# Patient Record
Sex: Male | Born: 1980 | Race: White | Hispanic: No | Marital: Single | Smoking: Never smoker
Health system: Southern US, Community
[De-identification: ages and names within clinical notes are randomized; demographics above are authoritative.]

---
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
## 2018-04-23 ENCOUNTER — Emergency Department (HOSPITAL_COMMUNITY): Payer: Self-pay

## 2018-04-23 ENCOUNTER — Other Ambulatory Visit: Payer: Self-pay

## 2018-04-23 ENCOUNTER — Emergency Department (HOSPITAL_COMMUNITY)
Admission: EM | Admit: 2018-04-23 | Discharge: 2018-04-23 | Disposition: A | Discharge status: Home / Self Care | Payer: Self-pay | Attending: Emergency Medicine | Admitting: Emergency Medicine

## 2018-04-23 ENCOUNTER — Encounter (HOSPITAL_COMMUNITY): Payer: Self-pay | Admitting: Emergency Medicine

## 2018-04-23 DIAGNOSIS — N200 Calculus of kidney: Secondary | ICD-10-CM | POA: Insufficient documentation

## 2018-04-23 DIAGNOSIS — R1110 Vomiting, unspecified: Secondary | ICD-10-CM | POA: Insufficient documentation

## 2018-04-23 DIAGNOSIS — F1210 Cannabis abuse, uncomplicated: Secondary | ICD-10-CM | POA: Insufficient documentation

## 2018-04-23 LAB — CBC WITH DIFFERENTIAL/PLATELET
Abs Immature Granulocytes: 0.1 10*3/uL (ref 0.0–0.1)
BASOS PCT: 0 %
Basophils Absolute: 0.1 10*3/uL (ref 0.0–0.1)
EOS ABS: 0.1 10*3/uL (ref 0.0–0.7)
Eosinophils Relative: 1 %
HEMATOCRIT: 48.5 % (ref 39.0–52.0)
Hemoglobin: 16.4 g/dL (ref 13.0–17.0)
Immature Granulocytes: 1 %
LYMPHS ABS: 0.7 10*3/uL (ref 0.7–4.0)
Lymphocytes Relative: 5 %
MCH: 28.9 pg (ref 26.0–34.0)
MCHC: 33.8 g/dL (ref 30.0–36.0)
MCV: 85.5 fL (ref 78.0–100.0)
Monocytes Absolute: 0.6 10*3/uL (ref 0.1–1.0)
Monocytes Relative: 4 %
Neutro Abs: 12.4 10*3/uL — ABNORMAL HIGH (ref 1.7–7.7)
Neutrophils Relative %: 89 %
Platelets: 183 10*3/uL (ref 150–400)
RBC: 5.67 MIL/uL (ref 4.22–5.81)
RDW: 11.9 % (ref 11.5–15.5)
WBC: 13.8 10*3/uL — AB (ref 4.0–10.5)

## 2018-04-23 LAB — COMPREHENSIVE METABOLIC PANEL
ALK PHOS: 58 U/L (ref 38–126)
ALT: 28 U/L (ref 0–44)
AST: 20 U/L (ref 15–41)
Albumin: 4.5 g/dL (ref 3.5–5.0)
Anion gap: 9 (ref 5–15)
BILIRUBIN TOTAL: 0.9 mg/dL (ref 0.3–1.2)
BUN: 14 mg/dL (ref 6–20)
CALCIUM: 10 mg/dL (ref 8.9–10.3)
CO2: 24 mmol/L (ref 22–32)
CREATININE: 1.4 mg/dL — AB (ref 0.61–1.24)
Chloride: 108 mmol/L (ref 98–111)
Glucose, Bld: 120 mg/dL — ABNORMAL HIGH (ref 70–99)
Potassium: 4.2 mmol/L (ref 3.5–5.1)
SODIUM: 141 mmol/L (ref 135–145)
TOTAL PROTEIN: 7.3 g/dL (ref 6.5–8.1)

## 2018-04-23 LAB — URINALYSIS, ROUTINE W REFLEX MICROSCOPIC
Bilirubin Urine: NEGATIVE
Glucose, UA: NEGATIVE mg/dL
Ketones, ur: 5 mg/dL — AB
Leukocytes, UA: NEGATIVE
NITRITE: NEGATIVE
PH: 5 (ref 5.0–8.0)
Protein, ur: NEGATIVE mg/dL
Specific Gravity, Urine: 1.028 (ref 1.005–1.030)

## 2018-04-23 LAB — LIPASE, BLOOD: Lipase: 30 U/L (ref 11–51)

## 2018-04-23 MED ORDER — ONDANSETRON 4 MG PO TBDP
4.0000 mg | ORAL_TABLET | Freq: Once | ORAL | Status: AC
  Administered 2018-04-23: 4 mg via ORAL
  Filled 2018-04-23: qty 1

## 2018-04-23 MED ORDER — KETOROLAC TROMETHAMINE 60 MG/2ML IM SOLN
60.0000 mg | Freq: Once | INTRAMUSCULAR | Status: AC
Start: 2018-04-23 — End: 2018-04-23
  Administered 2018-04-23: 60 mg via INTRAMUSCULAR
  Filled 2018-04-23: qty 2

## 2018-04-23 MED ORDER — FENTANYL CITRATE (PF) 100 MCG/2ML IJ SOLN
50.0000 ug | Freq: Once | INTRAMUSCULAR | Status: AC
Start: 2018-04-23 — End: 2018-04-23
  Administered 2018-04-23: 50 ug via INTRAMUSCULAR
  Filled 2018-04-23: qty 2

## 2018-04-23 MED ORDER — KETOROLAC TROMETHAMINE 10 MG PO TABS: 10.0000 mg | ORAL_TABLET | Freq: Four times a day (QID) | ORAL | 0 refills | Status: DC | PRN

## 2018-04-23 MED ORDER — TAMSULOSIN HCL 0.4 MG PO CAPS: 0.4000 mg | ORAL_CAPSULE | Freq: Every day | ORAL | 0 refills | Status: DC

## 2018-04-23 MED ORDER — OXYCODONE-ACETAMINOPHEN 5-325 MG PO TABS: 1.0000 | ORAL_TABLET | ORAL | 0 refills | Status: DC | PRN

## 2018-04-23 MED ORDER — TAMSULOSIN HCL 0.4 MG PO CAPS
0.4000 mg | ORAL_CAPSULE | Freq: Once | ORAL | Status: AC
  Administered 2018-04-23: 0.4 mg via ORAL
  Filled 2018-04-23: qty 1

## 2018-04-23 MED ORDER — ONDANSETRON HCL 4 MG PO TABS: 4.0000 mg | ORAL_TABLET | Freq: Three times a day (TID) | ORAL | 0 refills | Status: DC | PRN

## 2018-04-23 MED ORDER — OXYCODONE-ACETAMINOPHEN 5-325 MG PO TABS
1.0000 | ORAL_TABLET | Freq: Once | ORAL | Status: AC
  Administered 2018-04-23: 1 via ORAL
  Filled 2018-04-23: qty 1

## 2018-04-23 NOTE — ED Triage Notes (Signed)
Pt st's right flank pain woke him up this am.  St's pain has increased through out the day.  Pt also st's pain has caused him to have nausea and vomiting.

## 2018-04-23 NOTE — ED Provider Notes (Signed)
Emergency Department Provider Note   I have reviewed the triage vital signs and the nursing notes.   HISTORY  Chief Complaint Flank Pain   HPI Adam Padilla is a 37 y.o. male without significant past medical history the presents the emerge department today with approximately 12 hours of right flank pain.  Patient states that he noticed a dull ache that radiated from his right flank down to the top of his buttocks earlier this morning but then progressively worsened throughout the day and was very colicky in nature at one point having him on his hands and knees vomiting the pain was so bad.  Presented here for further evaluation.  No change in urination or defecation.  Patient had already been evaluated but at the time of my evaluation he does not have any nausea and his pain was significantly proved. No other associated or modifying symptoms.    History reviewed. No pertinent past medical history.  There are no active problems to display for this patient.   History reviewed. No pertinent surgical history.    Allergies Patient has no allergy information on record.  No family history on file.  Social History Social History   Tobacco Use  . Smoking status: Never Smoker  . Smokeless tobacco: Never Used  Substance Use Topics  . Alcohol use: Yes    Comment: very little  . Drug use: Yes    Types: Marijuana    Review of Systems  All other systems negative except as documented in the HPI. All pertinent positives and negatives as reviewed in the HPI. ____________________________________________   PHYSICAL EXAM:  VITAL SIGNS: ED Triage Vitals  Enc Vitals Group     BP 04/23/18 1531 (!) 149/88     Pulse Rate 04/23/18 1531 71     Resp 04/23/18 1531 16     Temp 04/23/18 1531 98.7 F (37.1 C)     Temp Source 04/23/18 1531 Oral     SpO2 04/23/18 1531 100 %     Weight 04/23/18 1545 205 lb (93 kg)     Height 04/23/18 1545 5\' 10"  (1.778 m)    Constitutional:  Alert and oriented. Well appearing and in no acute distress. Eyes: Conjunctivae are normal. PERRL. EOMI. Head: Atraumatic. Nose: No congestion/rhinnorhea. Mouth/Throat: Mucous membranes are moist.  Oropharynx non-erythematous. Neck: No stridor.  No meningeal signs.   Cardiovascular: Normal rate, regular rhythm. Good peripheral circulation. Grossly normal heart sounds.   Respiratory: Normal respiratory effort.  No retractions. Lungs CTAB. Gastrointestinal: Soft and nontender. No distention.  Musculoskeletal: No lower extremity tenderness nor edema. No gross deformities of extremities. Neurologic:  Normal speech and language. No gross focal neurologic deficits are appreciated.  Skin:  Skin is warm, dry and intact. No rash noted.   ____________________________________________   LABS (all labs ordered are listed, but only abnormal results are displayed)  Labs Reviewed  COMPREHENSIVE METABOLIC PANEL - Abnormal; Notable for the following components:      Result Value   Glucose, Bld 120 (*)    Creatinine, Ser 1.40 (*)    All other components within normal limits  CBC WITH DIFFERENTIAL/PLATELET - Abnormal; Notable for the following components:   WBC 13.8 (*)    Neutro Abs 12.4 (*)    All other components within normal limits  URINALYSIS, ROUTINE W REFLEX MICROSCOPIC - Abnormal; Notable for the following components:   APPearance TURBID (*)    Hgb urine dipstick MODERATE (*)    Ketones, ur 5 (*)  Bacteria, UA RARE (*)    All other components within normal limits  LIPASE, BLOOD   ____________________________________________  RADIOLOGY  Ct Renal Stone Study  Result Date: 04/23/2018 CLINICAL DATA:  Right flank pain since 2 p.m. EXAM: CT ABDOMEN AND PELVIS WITHOUT CONTRAST TECHNIQUE: Multidetector CT imaging of the abdomen and pelvis was performed following the standard protocol without IV contrast. COMPARISON:  None. FINDINGS: Lower chest: No acute abnormality. Hepatobiliary: No focal  liver abnormality is seen. No gallstones, gallbladder wall thickening, or biliary dilatation. Pancreas: Unremarkable. No pancreatic ductal dilatation or surrounding inflammatory changes. Spleen: Normal in size without focal abnormality. Adrenals/Urinary Tract: Bilateral adrenal glands are normal. There is right perinephric stranding with mild right hydroureteronephrosis due to obstruction by 1 mm stone in the distal right ureter just proximal to the ureteral vesicular junction. There is a 1 mm nonobstructing stone in left kidney. There is no left hydronephrosis. The bladder is partially decompressed without gross abnormality. Stomach/Bowel: Stomach is within normal limits. Appendix appears normal. No evidence of bowel wall thickening, distention, or inflammatory changes. Vascular/Lymphatic: No significant vascular findings are present. No enlarged abdominal or pelvic lymph nodes. Reproductive: Prostate is unremarkable. Other: None. 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 IMPRESSION: Mild right hydroureteronephrosis due to obstruction by 1 mm stone in the distal right ureter just proximal to the ureteral vesicular junction. Electronically Signed   By: Sherian Rein M.D.   On: 04/23/2018 17:32    ____________________________________________   INITIAL IMPRESSION / ASSESSMENT AND PLAN / ED COURSE  With hydronephrosis mild kidney injury secondary to kidney stone.  States that is 1 mm on CT scan however I feel this is likely just catching the edge of it is likely a little bit larger.  Patient is pain-free at this time will continue some narcotic pain medicine along with Toradol flomax and pending his urine to evaluate for infection.  Reevaluate but suspect patient will be stable for discharge  Symptom-free.  No urinary tract infection.  I suspect his renal function will improve with normal hydration after the kidney stone passes.  Stable for discharge.  Pertinent labs & imaging results  that were available during my care of the patient were reviewed by me and considered in my medical decision making (see chart for details).  ____________________________________________  FINAL CLINICAL IMPRESSION(S) / ED DIAGNOSES  Final diagnoses:  Kidney stone     MEDICATIONS GIVEN DURING THIS VISIT:  Medications  ondansetron (ZOFRAN-ODT) disintegrating tablet 4 mg (4 mg Oral Given 04/23/18 1600)  fentaNYL (SUBLIMAZE) injection 50 mcg (50 mcg Intramuscular Given 04/23/18 1601)  oxyCODONE-acetaminophen (PERCOCET/ROXICET) 5-325 MG per tablet 1 tablet (1 tablet Oral Given 04/23/18 1827)  ketorolac (TORADOL) injection 60 mg (60 mg Intramuscular Given 04/23/18 1827)  tamsulosin (FLOMAX) capsule 0.4 mg (0.4 mg Oral Given 04/23/18 1827)     NEW OUTPATIENT MEDICATIONS STARTED DURING THIS VISIT:  New Prescriptions   KETOROLAC (TORADOL) 10 MG TABLET    Take 1 tablet (10 mg total) by mouth every 6 (six) hours as needed.   ONDANSETRON (ZOFRAN) 4 MG TABLET    Take 1 tablet (4 mg total) by mouth every 8 (eight) hours as needed for nausea or vomiting.   OXYCODONE-ACETAMINOPHEN (PERCOCET) 5-325 MG TABLET    Take 1-2 tablets by mouth every 4 (four) hours as needed for severe pain.   TAMSULOSIN (FLOMAX) 0.4 MG CAPS CAPSULE    Take 1 capsule (0.4 mg total) by mouth daily. Until you pass the stone  Note:  This note was prepared with assistance of Dragon voice recognition software. Occasional wrong-word or sound-a-like substitutions may have occurred due to the inherent limitations of voice recognition software.   Marily Memos, MD 04/23/18 Jerene Bears

## 2018-04-23 NOTE — ED Notes (Signed)
Patient ambulatory to bathroom with steady gait at this time to attempt to provide urine sample

## 2018-04-23 NOTE — ED Notes (Signed)
ED Provider at bedside. Messner 

## 2018-04-23 NOTE — ED Provider Notes (Signed)
Patient placed in Quick Look pathway, seen and evaluated   Chief Complaint: right flank pain  HPI:   37 year old male presents with right flank pain that began this morning upon awakening around 7 AM this morning.  Patient reports that his pain initially was dull, but it has become sharper and more intense as the days progressed.  Patient reports some urinary urgency associated with this.  He notes pain has not made him nauseous and have several episodes of nonbilious, nonbloody emesis.  He has not been able to taste anything for this secondary to nausea.  He denies history of the same.  No history of kidney stones.  Patient denies any fever, abdominal pain, diarrhea, penile pain, testicular pain, penile discharge.  No prior abdominal surgeries.  He denies any back trauma.  ROS:  Positive ROS: (+) flank pain and urinary urgency Negative ROS: (-) fever, abdominal pain, diarrhea, penile pain, testicular pain penile discharge  Physical Exam:   Gen: No distress  Neuro: Awake and Alert  Skin: Warm  MSK: No thoracic, lumbar spinous process tenderness palpation or step-offs.  No paraspinal tenderness palpation.  Focused Exam: Heart RRR, nml S1,S2, no m/r/g; Lungs CTAB; Abd soft, NT, no rebound or guarding; No CVA tenderness  BP (!) 149/88 (BP Location: Right Arm)   Pulse 71   Temp 98.7 F (37.1 C) (Oral)   Resp 16   Ht 5\' 10"  (1.778 m)   Wt 93 kg   SpO2 100%   BMI 29.41 kg/m   Plan:  Based on initial evaluation, labs are indicated and radiology studies are indicated.  Patient counseled on process, plan, and necessity for staying for completing the evaluation."  Initiation of care has begun. The patient has been counseled on the process, plan, and necessity for staying for the completion/evaluation, and the remainder of the medical screening examination   Princella PellegriniMaczis, Davionte Lusby M, PA-C 04/23/18 1551    Pricilla LovelessGoldston, Scott, MD 04/24/18 1750

## 2018-12-14 IMAGING — CT CT RENAL STONE PROTOCOL
2 of 4 series · 16 of 46 positions shown, 18 images · non-contrast
Comparison: None.

CLINICAL DATA: Right flank pain since 2 p.m..

EXAM:
CT ABDOMEN AND PELVIS WITHOUT CONTRAST
TECHNIQUE: Multidetector CT imaging of the abdomen and pelvis was performed
following the standard protocol without IV contrast.

[Series 3: stone study 5.0 i30f 2 · axial · 0.76mm/px · z∈[+621,+1076]mm · 13 of 99 slices shown, 15 images]
[im 4/99  soft-tissue]
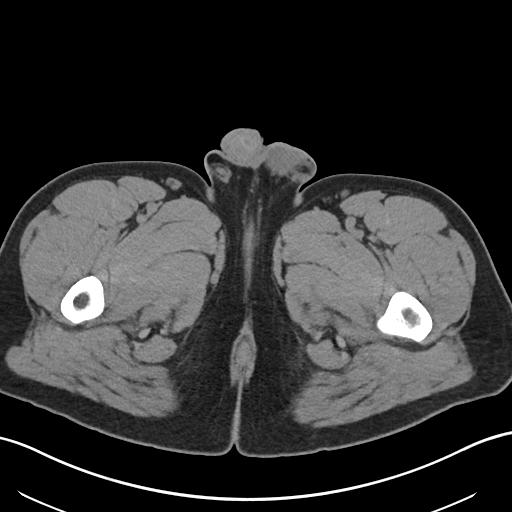
[im 4/99  bone]
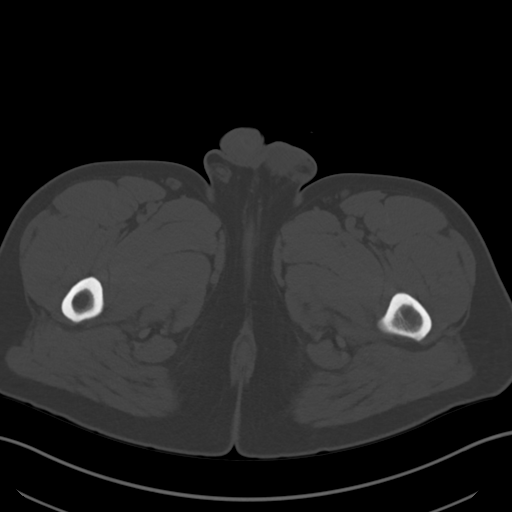
[im 12/99  soft-tissue]
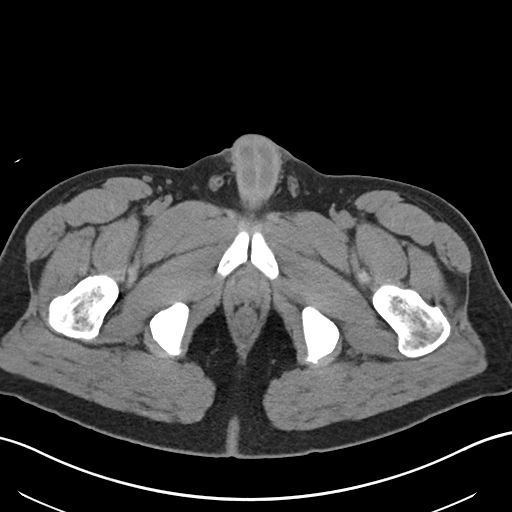
[im 20/99  soft-tissue]
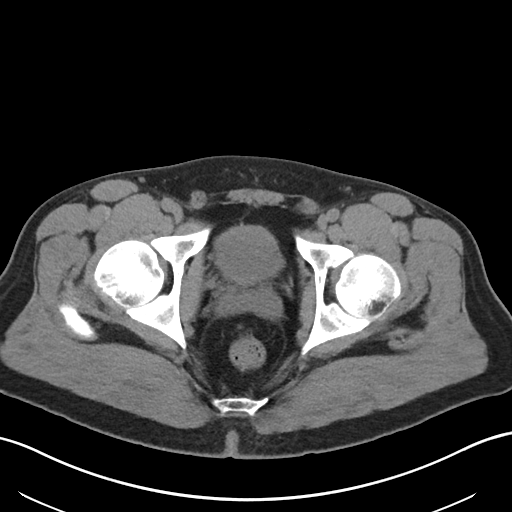
[im 28/99  soft-tissue]
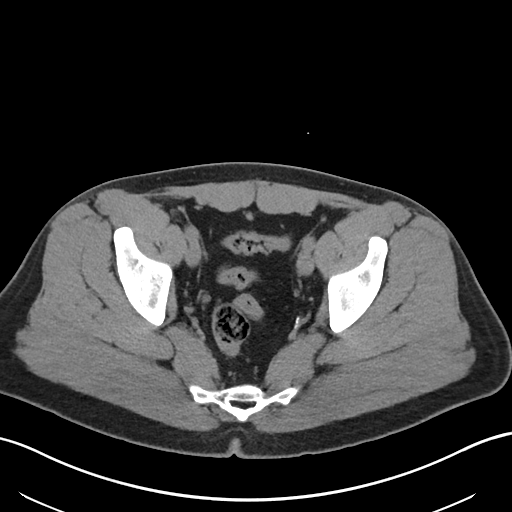
[im 36/99  soft-tissue]
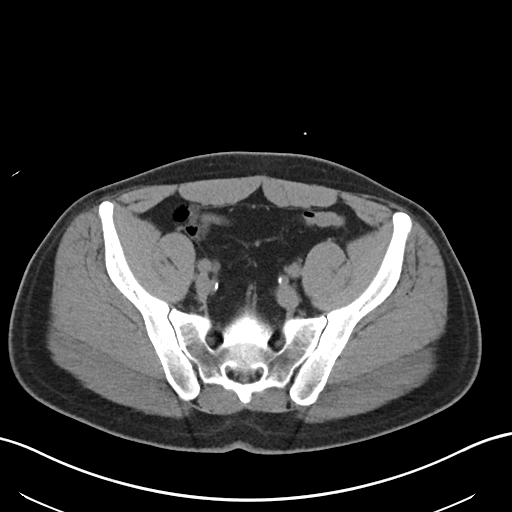
[im 44/99  soft-tissue]
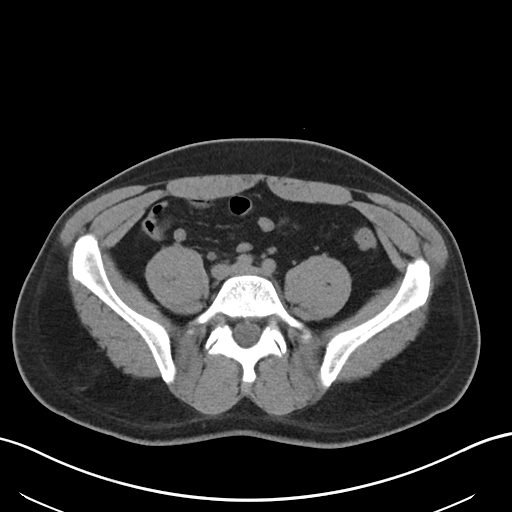
[im 51/99  soft-tissue]
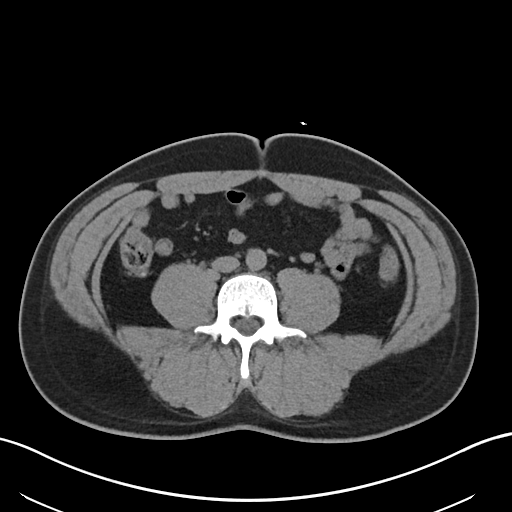
[im 55/99  soft-tissue]
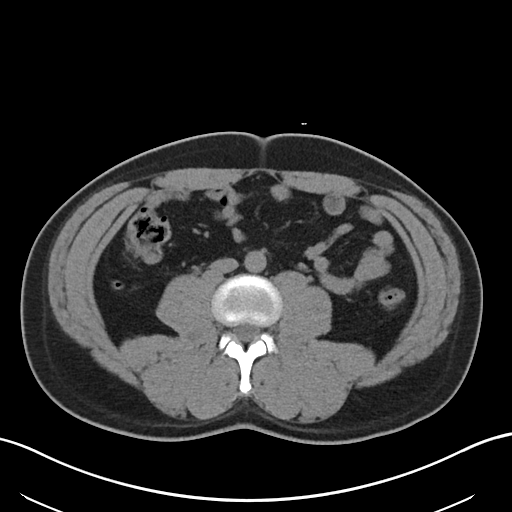
[im 63/99  soft-tissue]
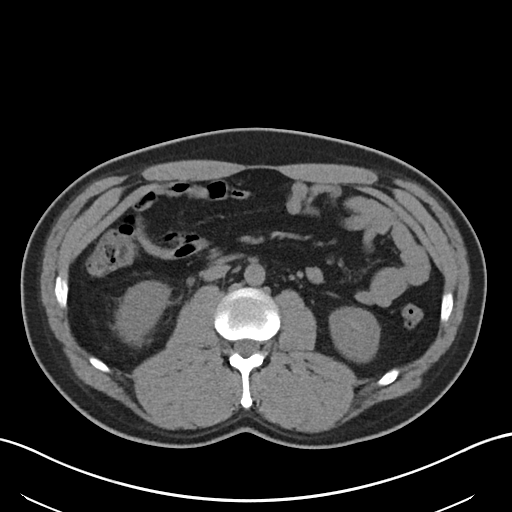
[im 63/99  bone]
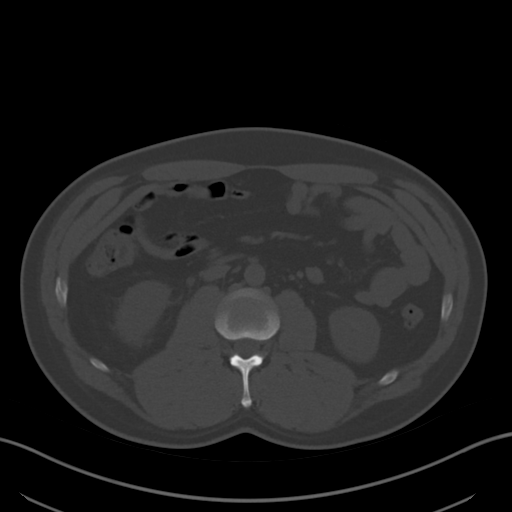
[im 71/99  soft-tissue]
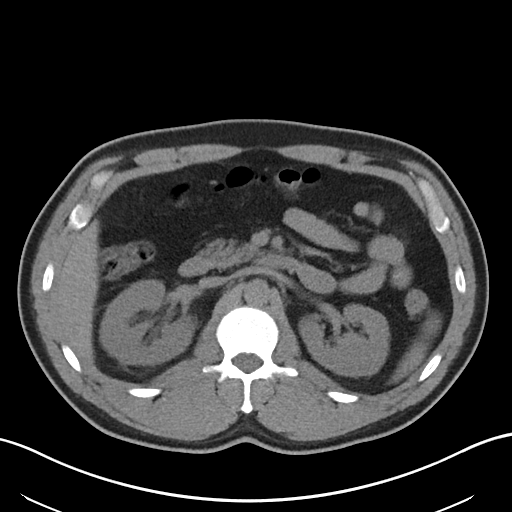
[im 79/99  soft-tissue]
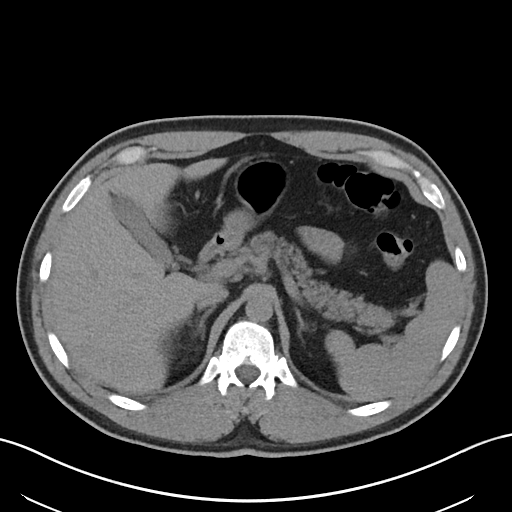
[im 87/99  soft-tissue]
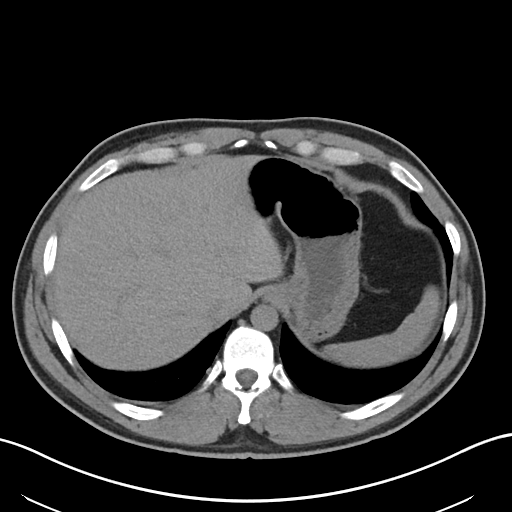
[im 95/99  soft-tissue]
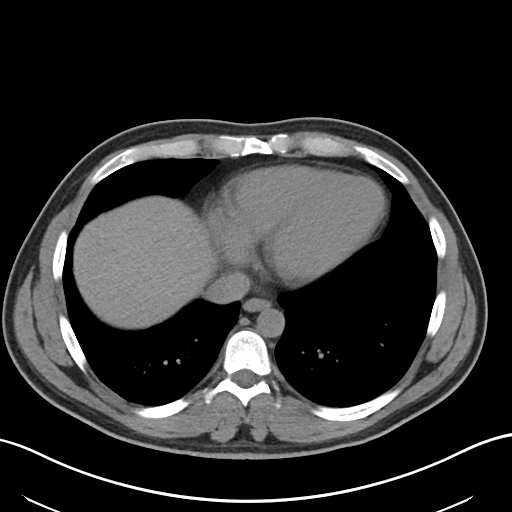

[Series 6: coronal soft tissue · coronal · 0.72mm/px · 3 of 101 slices shown]
[im 34/101  soft-tissue]
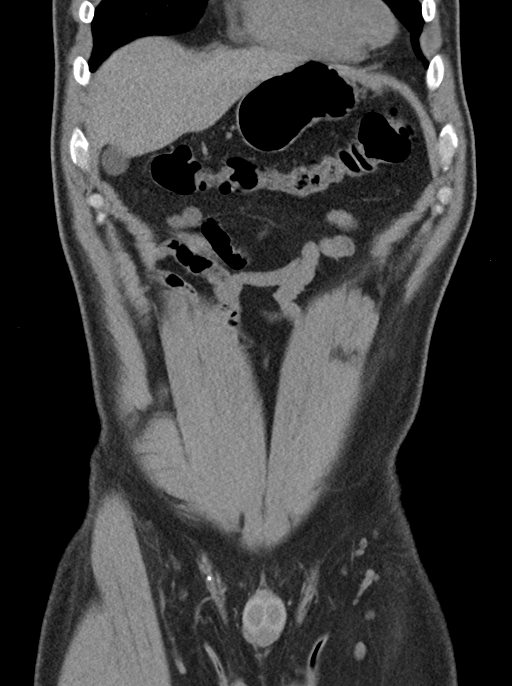
[im 45/101  soft-tissue]
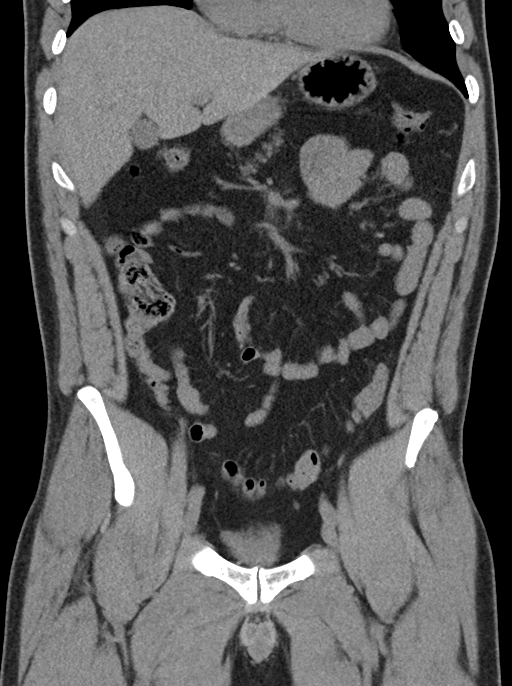
[im 56/101  soft-tissue]
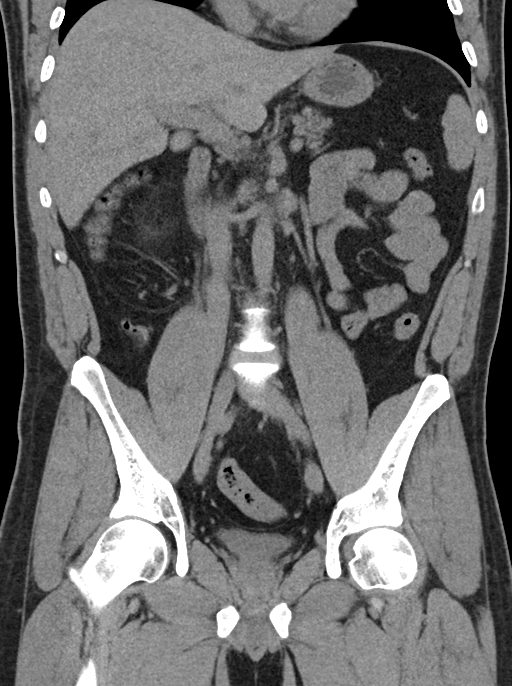

[16 of 46 positions shown; findings below may reference images not displayed]

FINDINGS: Lower chest: No acute abnormality.

Hepatobiliary: No focal liver abnormality is seen. No gallstones,
gallbladder wall thickening, or biliary dilatation.

Pancreas: Unremarkable. No pancreatic ductal dilatation or
surrounding inflammatory changes.

Spleen: Normal in size without focal abnormality.

Adrenals/Urinary Tract: Bilateral adrenal glands are normal. There
is right perinephric stranding with mild right hydroureteronephrosis
due to obstruction by 1 mm stone in the distal right ureter just
proximal to the ureteral vesicular junction. There is a 1 mm
nonobstructing stone in left kidney. There is no left
hydronephrosis. The bladder is partially decompressed without gross
abnormality.

Stomach/Bowel: Stomach is within normal limits. Appendix appears
normal. No evidence of bowel wall thickening, distention, or
inflammatory changes.

Vascular/Lymphatic: No significant vascular findings are present. No
enlarged abdominal or pelvic lymph nodes.

Reproductive: Prostate is unremarkable.

Other: None.

Musculoskeletal: Bilateral chronic pars defect of L5 are noted.
IMPRESSION: Mild right hydroureteronephrosis due to obstruction by 1 mm stone in
the distal right ureter just proximal to the ureteral vesicular
junction.

## 2023-03-05 ENCOUNTER — Ambulatory Visit: Payer: Self-pay | Admitting: Psychology

## 2023-12-14 ENCOUNTER — Ambulatory Visit (INDEPENDENT_AMBULATORY_CARE_PROVIDER_SITE_OTHER)
Admit: 2023-12-14 | Discharge: 2023-12-14 | Disposition: A | Discharge status: Home / Self Care | Attending: Family Medicine | Admitting: Family Medicine

## 2023-12-14 ENCOUNTER — Ambulatory Visit (HOSPITAL_BASED_OUTPATIENT_CLINIC_OR_DEPARTMENT_OTHER)
Admission: EM | Admit: 2023-12-14 | Discharge: 2023-12-14 | Disposition: A | Discharge status: Home / Self Care | Attending: Family Medicine | Admitting: Family Medicine

## 2023-12-14 ENCOUNTER — Encounter (HOSPITAL_BASED_OUTPATIENT_CLINIC_OR_DEPARTMENT_OTHER): Payer: Self-pay | Admitting: Emergency Medicine

## 2023-12-14 ENCOUNTER — Other Ambulatory Visit (HOSPITAL_BASED_OUTPATIENT_CLINIC_OR_DEPARTMENT_OTHER): Payer: Self-pay

## 2023-12-14 DIAGNOSIS — M25512 Pain in left shoulder: Secondary | ICD-10-CM

## 2023-12-14 DIAGNOSIS — M7552 Bursitis of left shoulder: Secondary | ICD-10-CM | POA: Diagnosis not present

## 2023-12-14 MED ORDER — CYCLOBENZAPRINE HCL 10 MG PO TABS
10.0000 mg | ORAL_TABLET | Freq: Every evening | ORAL | 0 refills | Status: AC | PRN
  Filled 2023-12-14: qty 15, 15d supply, fill #0

## 2023-12-14 MED ORDER — DICLOFENAC SODIUM 50 MG PO TBEC
50.0000 mg | DELAYED_RELEASE_TABLET | Freq: Three times a day (TID) | ORAL | 0 refills | Status: AC | PRN
  Filled 2023-12-14: qty 30, 10d supply, fill #0

## 2023-12-14 MED ORDER — PREDNISONE 20 MG PO TABS
20.0000 mg | ORAL_TABLET | Freq: Every day | ORAL | 0 refills | Status: AC
  Filled 2023-12-14: qty 5, 5d supply, fill #0

## 2023-12-14 NOTE — ED Triage Notes (Signed)
 Pt had a accident on a horse 2 months ago and is not sure if its causing the left shoulder pain but over that past 2 weeks ago he has had pain/ muscle spasms to left shoulder over the weekend pain was worse.

## 2023-12-14 NOTE — ED Provider Notes (Signed)
 [facility]    CSN: [id number] Arrival date & time: 12/14/23  0802      History   Chief Complaint No chief complaint on file.   HPI Adam Padilla is a 43 y.o. male.   Patient reports that he had an accident with his horse in early January 2025 and landed on his left shoulder.  He has had intermittent pain since that time in the left shoulder.  He has had good range of motion and mostly has felt fine.  On approximately 11/27/2023, he started with worsening left shoulder pain.  He does not remember a new or acute injury.  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  The pain has worsened almost daily since the middle of March.  Last night he went to sleep and within an hour he was awake with just pain of 7-10 on a 10 point scale in the left shoulder.  He ended up pacing all night and moving his shoulder trying to get some relief from pain.  He is walking around the room right now with his shoulder extended upwards and that helps the pain some.  He also often holds his arm similar to if it were in a sling and that sometimes relieves the pain.  Over-the-counter medications have not really helped his pain.     History reviewed. No pertinent past medical history.  There are no active problems to display for this patient.   History reviewed. No pertinent surgical history.     Home Medications    Prior to Admission medications   Medication Sig Start Date End Date Taking? Authorizing Provider  cyclobenzaprine (FLEXERIL) 10 MG tablet Take 1 tablet (10 mg total) by mouth at bedtime as needed for up to 15 days for muscle spasms. Do not use and drive. 12/14/23 12/29/23 Yes Prescilla Sours, FNP  diclofenac (VOLTAREN) 50 MG EC tablet Take 1 tablet (50 mg total) by mouth 3 (three) times daily with meals as needed for up to 10 days (shoulder pain). 12/14/23 12/24/23 Yes Prescilla Sours, FNP  predniSONE (DELTASONE) 20 MG tablet Take 1 tablet (20 mg total) by mouth daily  with breakfast for 5 days. 12/14/23 12/19/23 Yes Prescilla Sours, FNP    Family History History reviewed. No pertinent family history.  Social History Social History   Tobacco Use   Smoking status: Never   Smokeless tobacco: Never  Substance Use Topics   Alcohol use: Never   Drug use: Yes    Types: Marijuana     Allergies   Aspirin   Review of Systems Review of Systems  Constitutional:  Negative for fever.  Respiratory:  Negative for cough.   Cardiovascular:  Negative for chest pain.  Gastrointestinal:  Negative for abdominal pain, constipation, diarrhea, nausea and vomiting.  Musculoskeletal:  Negative for arthralgias, back pain and joint swelling (No swelling but persistent left shoulder pain for two months).  Skin:  Negative for color change and rash.  Neurological:  Negative for syncope.  All other systems reviewed and are negative.    Physical Exam Triage Vital Signs ED Triage Vitals [12/14/23 0817]  Encounter Vitals Group     BP      Systolic BP Percentile      Diastolic BP Percentile      Pulse      Resp      Temp      Temp src      SpO2  Weight      Height      Head Circumference      Peak Flow      Pain Score 7     Pain Loc      Pain Education      Exclude from Growth Chart    No data found.  Updated Vital Signs BP (!) 156/110 (BP Location: Right Arm)   Pulse 80   Temp 97.9 F (36.6 C) (Oral)   Resp 20   SpO2 97%   Visual Acuity Right Eye Distance:   Left Eye Distance:   Bilateral Distance:    Right Eye Near:   Left Eye Near:    Bilateral Near:     Physical Exam Vitals and nursing note reviewed.  Constitutional:      General: He is in acute distress (Pacing and clearly appears to be having significant pain in his left shoulder at this time.).     Appearance: He is well-developed. He is not ill-appearing, toxic-appearing or diaphoretic.  HENT:     Head: Normocephalic and atraumatic.     Right Ear: External ear normal.     Left  Ear: External ear normal.     Nose: Nose normal.     Mouth/Throat:     Lips: Pink.     Mouth: Mucous membranes are moist.  Eyes:     Conjunctiva/sclera: Conjunctivae normal.     Pupils: Pupils are equal, round, and reactive to light.  Cardiovascular:     Rate and Rhythm: Normal rate and regular rhythm.     Heart sounds: S1 normal and S2 normal. No murmur heard. Pulmonary:     Effort: Pulmonary effort is normal. No respiratory distress.     Breath sounds: Normal breath sounds. No decreased breath sounds, wheezing, rhonchi or rales.  Musculoskeletal:        General: No swelling.     Right shoulder: Normal.     Left shoulder: Swelling (left soulder appears slightly higher than his right shoulder) and tenderness (mild with compression of the muscles of his shoulder/upper arm) present. Normal range of motion.  Skin:    General: Skin is warm and dry.     Capillary Refill: Capillary refill takes less than 2 seconds.     Findings: No rash.  Neurological:     Mental Status: He is alert and oriented to person, place, and time.  Psychiatric:        Mood and Affect: Mood normal.      UC Treatments / Results  Labs (all labs ordered are listed, but only abnormal results are displayed) Labs Reviewed - No data to display  EKG   Radiology No results found.  Procedures Procedures (including critical care time)  Medications Ordered in UC Medications - No data to display  Initial Impression / Assessment and Plan / UC Course  I have reviewed the triage vital signs and the nursing notes.  Pertinent labs & imaging results that were available during my care of the patient were reviewed by me and considered in my medical decision making (see chart for details).     Plan of Care: Bursitis of left shoulder: X-rays appear negative.  Will update the patient if the radiology review differs.  Cyclobenzaprine 10 mg nightly for muscle spasm.  Diclofenac 50 mg every 8 hours if needed for pain.   Prednisone 20 mg daily for 5 days.  See below for specific instructions about medication use.  Encouraged shoulder exercises with handout  provided.  Work excuse provided.  If pain persist needs to see orthopedics.  Follow-up here if symptoms do not improve, worsen or new symptoms occur.  Final Clinical Impressions(s) / UC Diagnoses   Final diagnoses:  Acute pain of left shoulder  Bursitis of left shoulder     Discharge Instructions      Diagnosis today is bursitis of left shoulder.  X-rays appear negative.  Will contact the patient if radiology provides a different interpretation.  Cyclobenzaprine 10 mg nightly for muscle spasms.  Do not use and drive.  Diclofenac, 50 mg 1 pill every 8 hours with food, as needed for pain.  May take with cyclobenzaprine.  May use the diclofenac and drive or work.  Prednisone 20 mg 1 daily for 5 days.  If pain persist needs to see orthopedics.  Work excuse provided.  Follow-up here if symptoms do not improve, worsen or new symptoms occur.     ED Prescriptions     Medication Sig Dispense Auth. Provider   predniSONE (DELTASONE) 20 MG tablet Take 1 tablet (20 mg total) by mouth daily with breakfast for 5 days. 5 tablet Prescilla Sours, FNP   diclofenac (VOLTAREN) 50 MG EC tablet Take 1 tablet (50 mg total) by mouth 3 (three) times daily with meals as needed for up to 10 days (shoulder pain). 30 tablet Prescilla Sours, FNP   cyclobenzaprine (FLEXERIL) 10 MG tablet Take 1 tablet (10 mg total) by mouth at bedtime as needed for up to 15 days for muscle spasms. Do not use and drive. 15 tablet Prescilla Sours, FNP      I have reviewed the PDMP during this encounter.   Prescilla Sours, FNP 12/14/23 [id number]

## 2023-12-14 NOTE — Discharge Instructions (Addendum)
 Diagnosis today is bursitis of left shoulder.  X-rays appear negative.  Will contact the patient if radiology provides a different interpretation.  Cyclobenzaprine 10 mg nightly for muscle spasms.  Do not use and drive.  Diclofenac, 50 mg 1 pill every 8 hours with food, as needed for pain.  May take with cyclobenzaprine.  May use the diclofenac and drive or work.  Prednisone 20 mg 1 daily for 5 days.  If pain persist needs to see orthopedics.  Work excuse provided.  Follow-up here if symptoms do not improve, worsen or new symptoms occur.

## 2023-12-14 NOTE — Progress Notes (Signed)
 Left Shoulder X-ray IMPRESSION:  Unremarkable left shoulder radiographs.  Patient was given these results during his visit.  No change in the plan of care.
